# Patient Record
Sex: Female | Born: 1990 | Hispanic: Yes | Marital: Single | Smoking: Never smoker
Health system: Southern US, Community
[De-identification: ages and names within clinical notes are randomized; demographics above are authoritative.]

---

## 2018-11-08 ENCOUNTER — Encounter: Payer: Self-pay | Admitting: Emergency Medicine

## 2018-11-08 ENCOUNTER — Emergency Department: Payer: BLUE CROSS/BLUE SHIELD

## 2018-11-08 ENCOUNTER — Emergency Department
Admission: EM | Admit: 2018-11-08 | Discharge: 2018-11-08 | Disposition: A | Discharge status: Home / Self Care | Payer: BLUE CROSS/BLUE SHIELD | Attending: Emergency Medicine | Admitting: Emergency Medicine

## 2018-11-08 ENCOUNTER — Other Ambulatory Visit: Payer: Self-pay

## 2018-11-08 DIAGNOSIS — N23 Unspecified renal colic: Secondary | ICD-10-CM | POA: Diagnosis not present

## 2018-11-08 DIAGNOSIS — R1011 Right upper quadrant pain: Secondary | ICD-10-CM | POA: Insufficient documentation

## 2018-11-08 LAB — CBC WITH DIFFERENTIAL/PLATELET
Abs Immature Granulocytes: 0.03 10*3/uL (ref 0.00–0.07)
BASOS ABS: 0 10*3/uL (ref 0.0–0.1)
Basophils Relative: 0 %
Eosinophils Absolute: 0 10*3/uL (ref 0.0–0.5)
Eosinophils Relative: 0 %
HEMATOCRIT: 40.1 % (ref 36.0–46.0)
Hemoglobin: 13.5 g/dL (ref 12.0–15.0)
Immature Granulocytes: 1 %
LYMPHS ABS: 1 10*3/uL (ref 0.7–4.0)
Lymphocytes Relative: 20 %
MCH: 29.4 pg (ref 26.0–34.0)
MCHC: 33.7 g/dL (ref 30.0–36.0)
MCV: 87.4 fL (ref 80.0–100.0)
Monocytes Absolute: 0.3 10*3/uL (ref 0.1–1.0)
Monocytes Relative: 7 %
Neutro Abs: 3.5 10*3/uL (ref 1.7–7.7)
Neutrophils Relative %: 72 %
Platelets: 266 10*3/uL (ref 150–400)
RBC: 4.59 MIL/uL (ref 3.87–5.11)
RDW: 12.3 % (ref 11.5–15.5)
WBC: 4.9 10*3/uL (ref 4.0–10.5)
nRBC: 0 % (ref 0.0–0.2)

## 2018-11-08 LAB — COMPREHENSIVE METABOLIC PANEL
ALT: 22 U/L (ref 0–44)
AST: 20 U/L (ref 15–41)
Albumin: 4.9 g/dL (ref 3.5–5.0)
Alkaline Phosphatase: 58 U/L (ref 38–126)
Anion gap: 8 (ref 5–15)
BUN: 14 mg/dL (ref 6–20)
CO2: 26 mmol/L (ref 22–32)
CREATININE: 0.9 mg/dL (ref 0.44–1.00)
Calcium: 9.5 mg/dL (ref 8.9–10.3)
Chloride: 103 mmol/L (ref 98–111)
GFR calc Af Amer: >60 mL/min (ref >60)
GFR calc non Af Amer: >60 mL/min (ref >60)
Glucose, Bld: 137 mg/dL — ABNORMAL HIGH (ref 70–99)
Potassium: 3.8 mmol/L (ref 3.5–5.1)
Sodium: 137 mmol/L (ref 135–145)
Total Bilirubin: 0.6 mg/dL (ref 0.3–1.2)
Total Protein: 8 g/dL (ref 6.5–8.1)

## 2018-11-08 LAB — URINALYSIS, COMPLETE (UACMP) WITH MICROSCOPIC
Bilirubin Urine: NEGATIVE
Glucose, UA: NEGATIVE mg/dL
Ketones, ur: NEGATIVE mg/dL
Leukocytes, UA: NEGATIVE
Nitrite: NEGATIVE
Protein, ur: NEGATIVE mg/dL
Specific Gravity, Urine: 1.015 (ref 1.005–1.030)
pH: 6 (ref 5.0–8.0)

## 2018-11-08 LAB — POC URINE PREG, ED: Preg Test, Ur: NEGATIVE

## 2018-11-08 LAB — LIPASE, BLOOD: Lipase: 32 U/L (ref 11–51)

## 2018-11-08 MED ORDER — IBUPROFEN 200 MG PO TABS: 600.0000 mg | ORAL_TABLET | Freq: Four times a day (QID) | ORAL | 0 refills | Status: AC | PRN

## 2018-11-08 MED ORDER — KETOROLAC TROMETHAMINE 30 MG/ML IJ SOLN
15.0000 mg | Freq: Once | INTRAMUSCULAR | Status: AC
  Administered 2018-11-08: 15 mg via INTRAVENOUS
  Filled 2018-11-08: qty 1

## 2018-11-08 MED ORDER — OXYCODONE-ACETAMINOPHEN 5-325 MG PO TABS: 1.0000 | ORAL_TABLET | ORAL | 0 refills | Status: AC | PRN

## 2018-11-08 MED ORDER — SODIUM CHLORIDE 0.9 % IV BOLUS
1000.0000 mL | Freq: Once | INTRAVENOUS | Status: AC
  Administered 2018-11-08: 1000 mL via INTRAVENOUS

## 2018-11-08 MED ORDER — ONDANSETRON HCL 4 MG PO TABS: 4.0000 mg | ORAL_TABLET | Freq: Three times a day (TID) | ORAL | 0 refills | Status: AC | PRN

## 2018-11-08 MED ORDER — ONDANSETRON HCL 4 MG/2ML IJ SOLN
4.0000 mg | Freq: Once | INTRAMUSCULAR | Status: AC
  Administered 2018-11-08: 4 mg via INTRAVENOUS
  Filled 2018-11-08: qty 2

## 2018-11-08 MED ORDER — FENTANYL CITRATE (PF) 100 MCG/2ML IJ SOLN
50.0000 ug | Freq: Once | INTRAMUSCULAR | Status: AC
  Administered 2018-11-08: 50 ug via INTRAVENOUS
  Filled 2018-11-08: qty 2

## 2018-11-08 MED ORDER — TAMSULOSIN HCL 0.4 MG PO CAPS: 0.4000 mg | ORAL_CAPSULE | Freq: Every day | ORAL | 0 refills | Status: AC

## 2018-11-08 NOTE — ED Notes (Signed)
Patient transported to Ultrasound 

## 2018-11-08 NOTE — ED Provider Notes (Signed)
[facility] Emergency Department Provider Note  ____________________________________________   I have reviewed the triage vital signs and the nursing notes. Where available I have reviewed prior notes and, if possible and indicated, outside hospital notes.    HISTORY  Chief Complaint Abdominal Pain    HPI Shelby Soto is a 27 y.o. female who is healthy, denies pregnancy, states that she has right upper quadrant pain radiating to the right flank which is been there since this morning.  Began fairly rapidly but not instantly.  Positive vomiting.  No fever.  No diarrhea.  No hematemesis.  Pain is significant.  Nothing makes it better nothing makes it worse.  Is an achy pain. No prior treatment no other associated features has not had this before  History reviewed. No pertinent past medical history.  There are no active problems to display for this patient.   History reviewed. No pertinent surgical history.  Prior to Admission medications   Not on File    Allergies Patient has no known allergies.  History reviewed. No pertinent family history.  Social History Social History   Tobacco Use  . Smoking status: Never Smoker  . Smokeless tobacco: Never Used  Substance Use Topics  . Alcohol use: Not on file  . Drug use: Not on file    Review of Systems Constitutional: No fever/chills Eyes: No visual changes. ENT: No sore throat. No stiff neck no neck pain Cardiovascular: Denies chest pain. Respiratory: Denies shortness of breath. Gastrointestinal:   + vomiting.  No diarrhea.  No constipation. Genitourinary: Negative for dysuria. Musculoskeletal: Negative lower extremity swelling Skin: Negative for rash. Neurological: Negative for severe headaches, focal weakness or numbness.   ____________________________________________   PHYSICAL EXAM:  VITAL SIGNS: ED Triage Vitals [11/08/18 0943]  Enc Vitals Group     BP 139/89     Pulse Rate  83     Resp 18     Temp 98.2 F (36.8 C)     Temp Source Oral     SpO2 99 %     Weight 140 lb (63.5 kg)     Height 5\' 1"  (1.549 m)     Head Circumference      Peak Flow      Pain Score 10     Pain Loc      Pain Edu?      Excl. in GC?     Constitutional: Alert and oriented. Well appearing appears uncomfortable but nontoxic Eyes: Conjunctivae are normal Head: Atraumatic HEENT: No congestion/rhinnorhea. Mucous membranes are moist.  Oropharynx non-erythematous Neck:   Nontender with no meningismus, no masses, no stridor Cardiovascular: Normal rate, regular rhythm. Grossly normal heart sounds.  Good peripheral circulation. Respiratory: Normal respiratory effort.  No retractions. Lungs CTAB. Abdominal: Soft and positive tenderness palpation the right upper quadrant with voluntary guarding, pain does seem to radiate a little bit towards the right flank but principally is in the right upper quadrant. No distention.no rebound Back:  There is no focal tenderness or step off.  there is no midline tenderness there are no lesions noted. there is no CVA tenderness  Musculoskeletal: No lower extremity tenderness, no upper extremity tenderness. No joint effusions, no DVT signs strong distal pulses no edema Neurologic:  Normal speech and language. No gross focal neurologic deficits are appreciated.  Skin:  Skin is warm, dry and intact. No rash noted. Psychiatric: Mood and affect are normal. Speech and behavior are normal.  ____________________________________________   LABS (all  labs ordered are listed, but only abnormal results are displayed)  Labs Reviewed  COMPREHENSIVE METABOLIC PANEL  CBC WITH DIFFERENTIAL/PLATELET  LIPASE, BLOOD  URINALYSIS, COMPLETE (UACMP) WITH MICROSCOPIC  POC URINE PREG, ED    Pertinent labs  results that were available during my care of the patient were reviewed by me and considered in my medical decision making (see chart for  details). ____________________________________________  EKG  I personally interpreted any EKGs ordered by me or triage  ____________________________________________  RADIOLOGY  Pertinent labs & imaging results that were available during my care of the patient were reviewed by me and considered in my medical decision making (see chart for details). If possible, patient and/or family made aware of any abnormal findings.  No results found. ____________________________________________    PROCEDURES  Procedure(s) performed: None  Procedures  Critical Care performed: None  ____________________________________________   INITIAL IMPRESSION / ASSESSMENT AND PLAN / ED COURSE  Pertinent labs & imaging results that were available during my care of the patient were reviewed by me and considered in my medical decision making (see chart for details).  Patient here with abdominal pain, mostly in the right upper quadrant, significant, she looks uncomfortable we are giving her IV fluid, pain medication will obtain ultrasound blood work and pregnancy test and reassess.    ____________________________________________   FINAL CLINICAL IMPRESSION(S) / ED DIAGNOSES  Final diagnoses:  RUQ abdominal pain      This chart was dictated using voice recognition software.  Despite best efforts to proofread,  errors can occur which can change meaning.      Jeanmarie PlantMcShane, Nerine Pulse A, MD 11/08/18 [id number]

## 2018-11-08 NOTE — Discharge Instructions (Signed)
Do not drive if the medications he received in the emergency room.  We will send you with a prescription for Percocet for severe breakthrough pain.  Do not drink or drive on Percocet.  We will also send you home with a prescription for Motrin or you may take that over-the-counter, that is more likely to help your pain and the Percocet.  If you have fever, or severe pain return to the emergency room.  Otherwise please follow-up with urology.

## 2018-11-08 NOTE — ED Triage Notes (Signed)
Pt arrived via POV with reports of abdominal pain that started today, worse on the right side, pt reports vomiting as well.

## 2018-11-08 NOTE — ED Triage Notes (Signed)
First Nurse Note:  C/O severe right sided abdominal pain.  Onset of symptoms this morning.  Also c/o emesis.   Patient is AAOx3.  Skin flushed.  Appears uncomfortable.

## 2018-11-12 ENCOUNTER — Ambulatory Visit
Admission: RE | Admit: 2018-11-12 | Discharge: 2018-11-12 | Disposition: A | Discharge status: Home / Self Care | Payer: BLUE CROSS/BLUE SHIELD | Source: Ambulatory Visit | Attending: Urology | Admitting: Urology

## 2018-11-12 ENCOUNTER — Ambulatory Visit: Payer: BLUE CROSS/BLUE SHIELD | Admitting: Urology

## 2018-11-12 ENCOUNTER — Encounter: Payer: Self-pay | Admitting: Urology

## 2018-11-12 VITALS — BP 114/76 | HR 82 | Ht 61.0 in | Wt 141.2 lb

## 2018-11-12 DIAGNOSIS — N201 Calculus of ureter: Secondary | ICD-10-CM

## 2018-11-12 LAB — URINALYSIS, COMPLETE
Bilirubin, UA: NEGATIVE
Glucose, UA: NEGATIVE
Ketones, UA: NEGATIVE
Nitrite, UA: NEGATIVE
PH UA: 6.5 (ref 5.0–7.5)
Protein, UA: NEGATIVE
Specific Gravity, UA: 1.02 (ref 1.005–1.030)
Urobilinogen, Ur: 0.2 mg/dL (ref 0.2–1.0)

## 2018-11-12 LAB — MICROSCOPIC EXAMINATION

## 2018-11-12 NOTE — Progress Notes (Signed)
11/12/2018 2:25 PM   Shelby Soto Apr 22, 1991 [id number]  Referring provider: No referring provider defined for this encounter.  Chief Complaint  Patient presents with  . Establish Care  . Nephrolithiasis    HPI: Shelby Soto is a 27 y.o. female who presented to the [facility] ED on 11/08/2018 with acute onset of right flank pain radiating to the right upper quadrant.  There were no identifiable precipitating, aggravating or alleviating factors.  She had nausea and vomiting.  Denied fever or chills.  The pain was rated severe.  A stone protocol CT was performed which showed a 4 mm right UVJ calculus.  No other urinary tract calculi were identified.  She continued to have intermittent symptoms however states today her pain abruptly stopped.  She has been straining her urine and is not aware of passing a stone.  She denies previous history of stone disease.  She does relate to a family history of stone disease.   PMH: No past medical history on file.  Surgical History: No past surgical history on file.  Home Medications:  Allergies as of 11/12/2018   No Known Allergies     Medication List       Accurate as of November 12, 2018  2:25 PM. Always use your most recent med list.        ibuprofen 200 MG tablet Commonly known as:  MOTRIN IB Take 3 tablets (600 mg total) by mouth every 6 (six) hours as needed.   ondansetron 4 MG tablet Commonly known as:  ZOFRAN Take 1 tablet (4 mg total) by mouth every 8 (eight) hours as needed for nausea or vomiting.   oxyCODONE-acetaminophen 5-325 MG tablet Commonly known as:  PERCOCET Take 1 tablet by mouth every 4 (four) hours as needed for severe pain.   tamsulosin 0.4 MG Caps capsule Commonly known as:  FLOMAX Take 1 capsule (0.4 mg total) by mouth daily.       Allergies: No Known Allergies  Family History: No family history on file.  Social History:  reports that she has never smoked. She has never used smokeless  tobacco. No history on file for alcohol and drug.  ROS: UROLOGY Frequent Urination?: Yes Hard to postpone urination?: No Burning/pain with urination?: No Get up at night to urinate?: Yes Leakage of urine?: No Urine stream starts and stops?: No Trouble starting stream?: No Do you have to strain to urinate?: No Blood in urine?: No Urinary tract infection?: No Sexually transmitted disease?: No Injury to kidneys or bladder?: No Painful intercourse?: No Weak stream?: No Currently pregnant?: No Vaginal bleeding?: No Last menstrual period?: 11/04/18  Gastrointestinal Nausea?: No Vomiting?: No Indigestion/heartburn?: No Diarrhea?: No Constipation?: No  Constitutional Fever: No Night sweats?: No Weight loss?: No Fatigue?: No  Skin Skin rash/lesions?: No Itching?: No  Eyes Blurred vision?: No Double vision?: No  Ears/Nose/Throat Sore throat?: No Sinus problems?: No  Hematologic/Lymphatic Swollen glands?: No Easy bruising?: No  Cardiovascular Leg swelling?: No Chest pain?: No  Respiratory Cough?: No Shortness of breath?: No  Endocrine Excessive thirst?: No  Musculoskeletal Back pain?: No Joint pain?: No  Neurological Headaches?: No Dizziness?: No  Psychologic Depression?: No Anxiety?: No  Physical Exam: BP 114/76 (BP Location: Left Arm, Patient Position: Sitting)   Pulse 82   Ht 5\' 1"  (1.549 m)   Wt 141 lb 3.2 oz (64 kg)   LMP 11/04/2018 (Exact Date)   BMI 26.68 kg/m   Constitutional:  Alert and oriented, No acute  distress. HEENT: [place] AT, moist mucus membranes.  Trachea midline, no masses. Cardiovascular: No clubbing, cyanosis, or edema. Respiratory: Normal respiratory effort, no increased work of breathing. GI: Abdomen is soft, nontender, nondistended, no abdominal masses GU: No CVA tenderness Lymph: No cervical or inguinal lymphadenopathy. Skin: No rashes, bruises or suspicious lesions. Neurologic: Grossly intact, no focal deficits,  moving all 4 extremities. Psychiatric: Normal mood and affect.  Laboratory Data: Lab Results  Component Value Date   WBC 4.9 11/08/2018   HGB 13.5 11/08/2018   HCT 40.1 11/08/2018   MCV 87.4 11/08/2018   PLT 266 11/08/2018    Lab Results  Component Value Date   CREATININE 0.90 11/08/2018    Pertinent Imaging: CT was personally reviewed  Results for orders placed during the hospital encounter of 11/08/18  CT Renal Stone Study   Narrative CLINICAL DATA:  Right-sided flank pain for several hours  EXAM: CT ABDOMEN AND PELVIS WITHOUT CONTRAST  TECHNIQUE: Multidetector CT imaging of the abdomen and pelvis was performed following the standard protocol without IV contrast.  COMPARISON:  None.  FINDINGS: Lower chest: No acute abnormality.  Hepatobiliary: Fatty infiltration of the liver is noted with mild focal fatty sparing adjacent to the gallbladder. The gallbladder is within normal limits.  Pancreas: Unremarkable. No pancreatic ductal dilatation or surrounding inflammatory changes.  Spleen: Normal in size without focal abnormality.  Adrenals/Urinary Tract: Adrenal glands are within normal limits. The kidneys are well visualized bilaterally. No definitive renal calculi are seen. There is mild hydronephrosis and hydroureter noted on the right with a 4 mm obstructing right UVJ stone identified. The bladder is within normal limits.  Stomach/Bowel: Colon is within normal limits. The appendix is unremarkable. No small bowel dilatation is seen. The stomach shows a small hiatal hernia.  Vascular/Lymphatic: No significant vascular findings are present. No enlarged abdominal or pelvic lymph nodes.  Reproductive: Uterus and bilateral adnexa are unremarkable.  Other: No abdominal wall hernia or abnormality. No abdominopelvic ascites.  Musculoskeletal: No acute or significant osseous findings.  IMPRESSION: 4 mm right UVJ stone with obstructive change.  Fatty  liver.  No other focal abnormality is noted.   Electronically Signed   By: Alcide CleverMark  Lukens M.D.   On: 11/08/2018 11:25     Assessment & Plan:   27 year old female with a 4 mm right UVJ calculus.  She had abrupt resolution of her pain today and her stone has most likely passed into the bladder or she may have passed without knowing.  We will obtain a KUB today and she will be notified with results.  Based on her age I have recommended pursuing a metabolic evaluation to include blood work and a 24-hour urine study.  She is in agreement with this plan.    Riki AltesScott C Stoioff, MD  [facility] [address], [address] [place], [place] [address] [phone]

## 2018-11-13 ENCOUNTER — Encounter: Payer: Self-pay | Admitting: Urology

## 2018-11-15 ENCOUNTER — Telehealth: Payer: Self-pay | Admitting: Family Medicine

## 2018-11-15 ENCOUNTER — Other Ambulatory Visit: Payer: Self-pay | Admitting: Urology

## 2018-11-15 DIAGNOSIS — N132 Hydronephrosis with renal and ureteral calculous obstruction: Secondary | ICD-10-CM

## 2018-11-15 NOTE — Telephone Encounter (Signed)
Patient notified and will wait on imaging to contact her for an appointment.

## 2018-11-15 NOTE — Telephone Encounter (Signed)
-----   Message from Riki AltesScott C Stoioff, MD sent at 11/15/2018  7:23 AM EST ----- KUB was reviewed and the previously noted stone is not seen.  Her symptoms had resolved when she was seen in the office and if still asymptomatic she is most likely passed the stone.  Would recommend a follow-up renal ultrasound to document resolution of her hydronephrosis.  Order was entered and will contact with results.  If she is still symptomatic we can discuss ureteroscopic removal.

## 2018-11-28 ENCOUNTER — Ambulatory Visit: Payer: BLUE CROSS/BLUE SHIELD

## 2019-11-23 IMAGING — CT CT RENAL STONE PROTOCOL
3 of 4 series · 8 of 46 positions shown, 15 images · non-contrast
Comparison: None.

CLINICAL DATA: Right-sided flank pain for several hours

EXAM:
CT ABDOMEN AND PELVIS WITHOUT CONTRAST
TECHNIQUE: Multidetector CT imaging of the abdomen and pelvis was performed
following the standard protocol without IV contrast.

[Series 4: lung bases · axial · 0.65mm/px · z∈[-534,-478]mm · 4 of 19 slices shown, 9 images]
[im 4/19  soft-tissue]
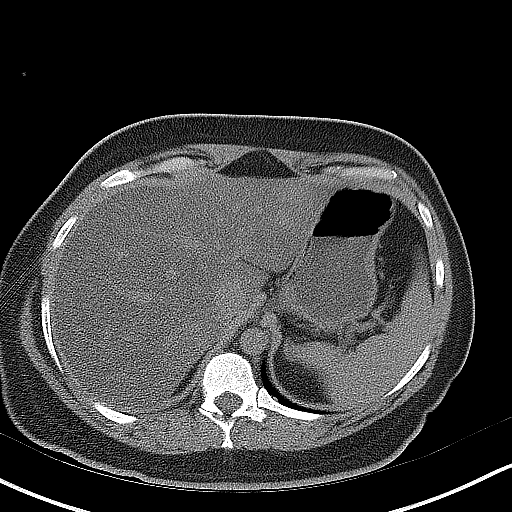
[im 4/19  lung]
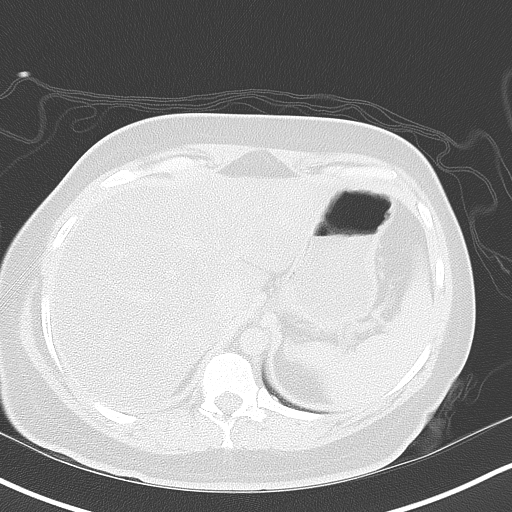
[im 4/19  bone]
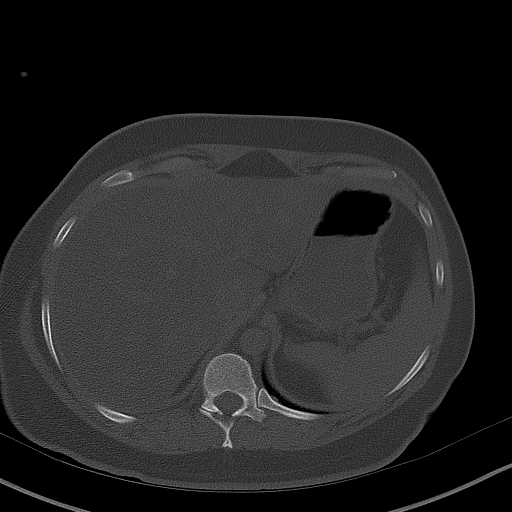
[im 8/19  soft-tissue]
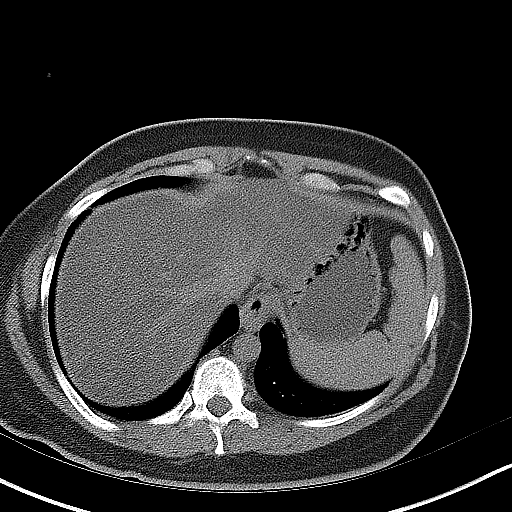
[im 8/19  lung]
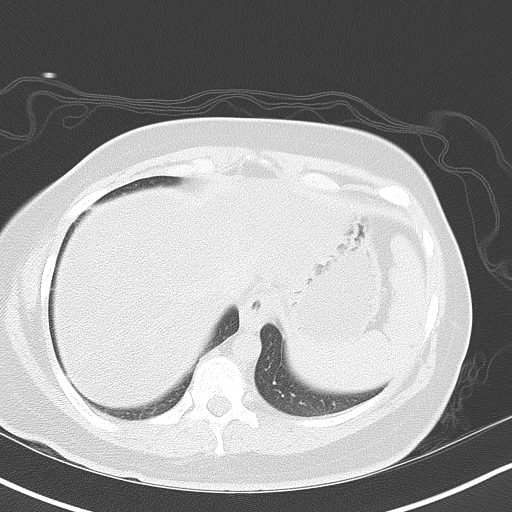
[im 11/19  soft-tissue]
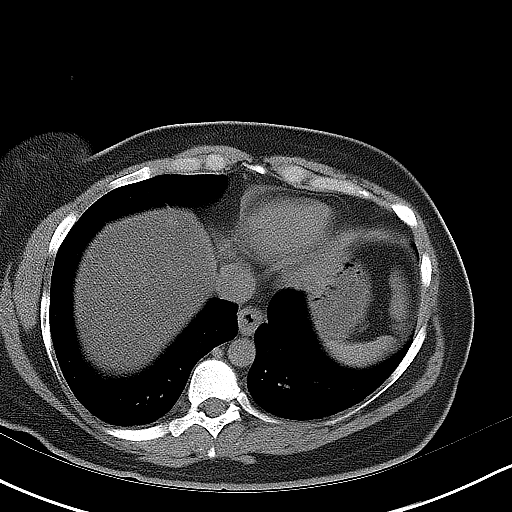
[im 11/19  lung]
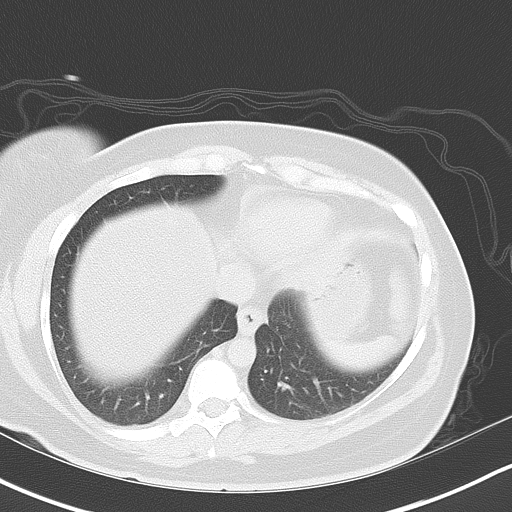
[im 15/19  soft-tissue]
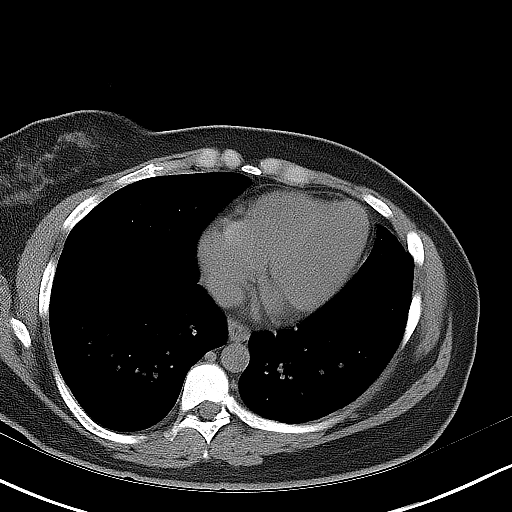
[im 15/19  lung]
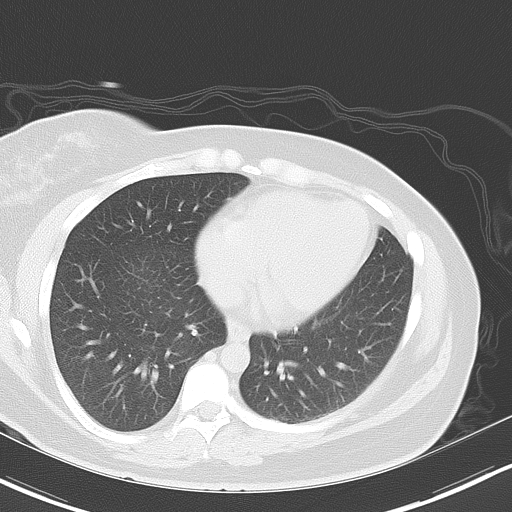

[Series 5: coronal · coronal · 0.69mm/px · 3 of 105 slices shown, 4 images]
[im 35/105  soft-tissue]
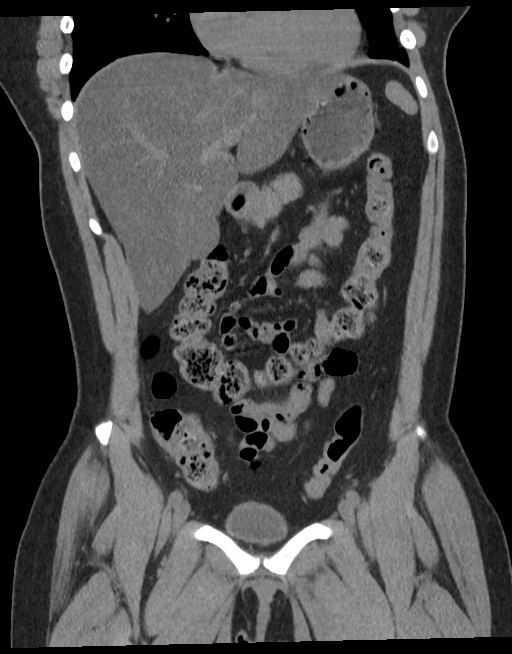
[im 47/105  soft-tissue]
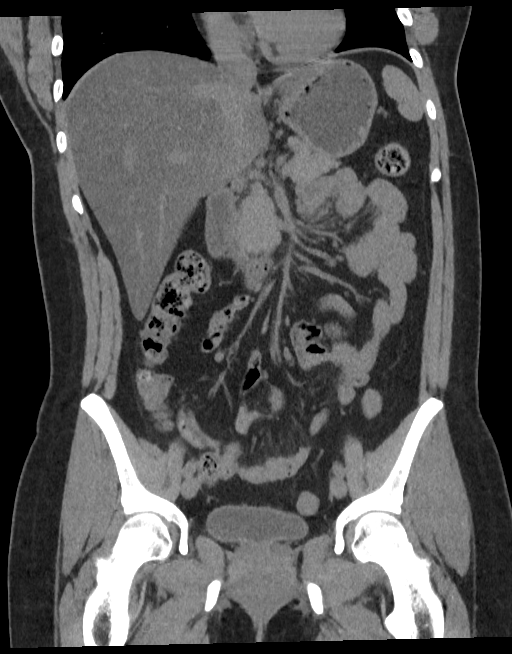
[im 47/105  bone]
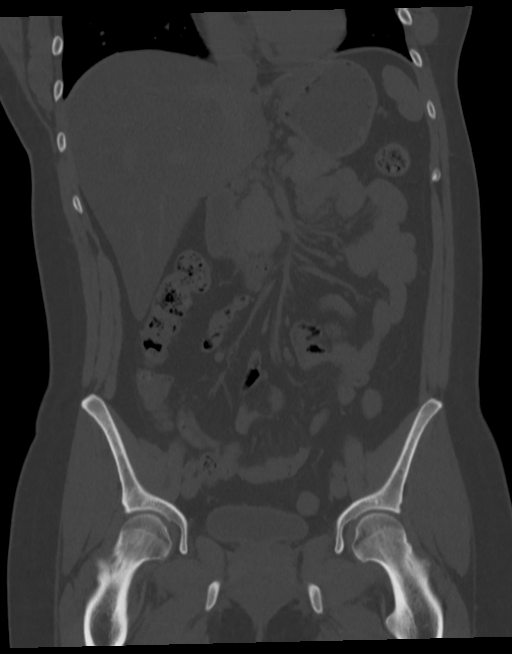
[im 58/105  soft-tissue]
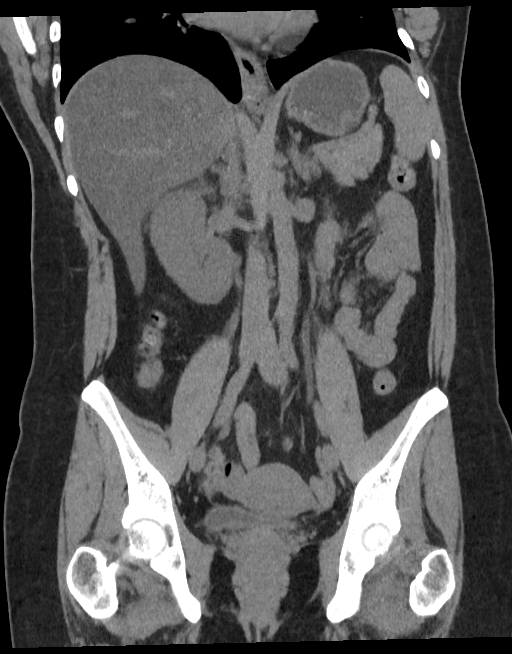

[Series 6: sagittal · sagittal · 0.58mm/px · 1 of 153 slices shown, 2 images]
[im 51/153  soft-tissue]
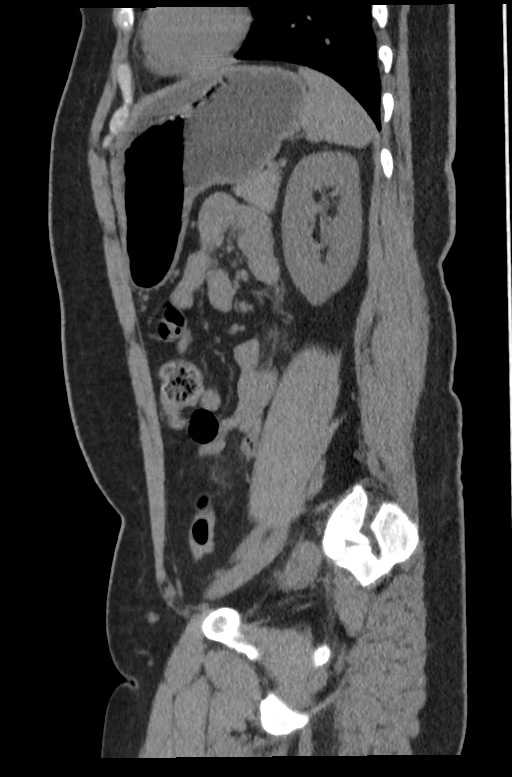
[im 51/153  bone]
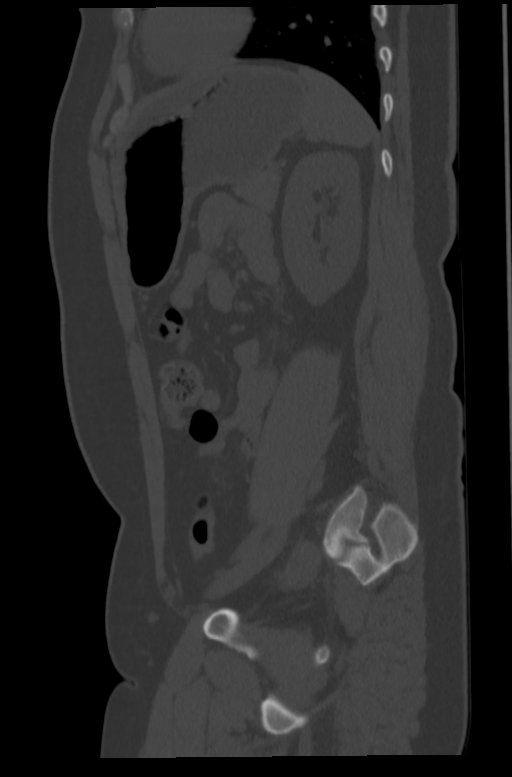

[8 of 46 positions shown; findings below may reference images not displayed]

FINDINGS: Lower chest: No acute abnormality.

Hepatobiliary: Fatty infiltration of the liver is noted with mild
focal fatty sparing adjacent to the gallbladder. The gallbladder is
within normal limits.

Pancreas: Unremarkable. No pancreatic ductal dilatation or
surrounding inflammatory changes.

Spleen: Normal in size without focal abnormality.

Adrenals/Urinary Tract: Adrenal glands are within normal limits. The
kidneys are well visualized bilaterally. No definitive renal calculi
are seen. There is mild hydronephrosis and hydroureter noted on the
right with a 4 mm obstructing right UVJ stone identified. The
bladder is within normal limits.

Stomach/Bowel: Colon is within normal limits. The appendix is
unremarkable. No small bowel dilatation is seen. The stomach shows a
small hiatal hernia.

Vascular/Lymphatic: No significant vascular findings are present. No
enlarged abdominal or pelvic lymph nodes.

Reproductive: Uterus and bilateral adnexa are unremarkable.

Other: No abdominal wall hernia or abnormality. No abdominopelvic
ascites.

Musculoskeletal: No acute or significant osseous findings.
IMPRESSION: 4 mm right UVJ stone with obstructive change.

Fatty liver.

No other focal abnormality is noted.
